# Patient Record
Sex: Female | Born: 1980 | Race: White | Hispanic: No | Marital: Married | Smoking: Never smoker
Health system: Southern US, Community
[De-identification: ages and names within clinical notes are randomized; demographics above are authoritative.]

## PROBLEM LIST (undated history)

## (undated) DIAGNOSIS — L309 Dermatitis, unspecified: Secondary | ICD-10-CM

## (undated) DIAGNOSIS — E079 Disorder of thyroid, unspecified: Secondary | ICD-10-CM

## (undated) DIAGNOSIS — B360 Pityriasis versicolor: Secondary | ICD-10-CM

## (undated) DIAGNOSIS — E039 Hypothyroidism, unspecified: Secondary | ICD-10-CM

## (undated) HISTORY — DX: Hypothyroidism, unspecified: E03.9

## (undated) HISTORY — DX: Disorder of thyroid, unspecified: E07.9

## (undated) HISTORY — DX: Pityriasis versicolor: B36.0

## (undated) HISTORY — DX: Dermatitis, unspecified: L30.9

---

## 2007-03-20 ENCOUNTER — Encounter: Admission: RE | Admit: 2007-03-20 | Discharge: 2007-03-20 | Payer: Self-pay | Admitting: Obstetrics and Gynecology

## 2007-06-11 ENCOUNTER — Inpatient Hospital Stay (HOSPITAL_COMMUNITY): Admission: AD | Admit: 2007-06-11 | Discharge: 2007-06-13 | Payer: Self-pay | Admitting: Obstetrics and Gynecology

## 2010-02-24 ENCOUNTER — Inpatient Hospital Stay (HOSPITAL_COMMUNITY): Admission: RE | Admit: 2010-02-24 | Discharge: 2010-02-26 | Payer: Self-pay | Admitting: Obstetrics and Gynecology

## 2011-02-25 LAB — CBC
HCT: 34 % — ABNORMAL LOW (ref 36.0–46.0)
Hemoglobin: 11.4 g/dL — ABNORMAL LOW (ref 12.0–15.0)
RBC: 3.76 MIL/uL — ABNORMAL LOW (ref 3.87–5.11)
RBC: 4.2 MIL/uL (ref 3.87–5.11)
WBC: 13.6 10*3/uL — ABNORMAL HIGH (ref 4.0–10.5)
WBC: 9.2 10*3/uL (ref 4.0–10.5)

## 2011-02-25 LAB — RPR: RPR Ser Ql: NONREACTIVE

## 2011-09-18 LAB — CBC
HCT: 31.6 — ABNORMAL LOW
HCT: 39.2
Hemoglobin: 12.9
MCHC: 33
MCV: 88.1
MCV: 88.6
Platelets: 164
RDW: 14.6 — ABNORMAL HIGH
RDW: 14.7 — ABNORMAL HIGH
WBC: 14 — ABNORMAL HIGH

## 2013-03-02 ENCOUNTER — Telehealth: Payer: Self-pay | Admitting: *Deleted

## 2013-03-02 NOTE — Telephone Encounter (Signed)
NEEDS REFILL ON THYROID MED WITH UPDATED DOSAGE. PHARMACY [facility] IN [place] ON [address]

## 2013-03-05 NOTE — Telephone Encounter (Signed)
Her Levothyroxine was called in for 90 days with 1 refill. Her pharmacy did not keep her original rx given on 07/07/12. PG

## 2013-11-16 ENCOUNTER — Encounter: Payer: Self-pay | Admitting: *Deleted

## 2013-11-16 DIAGNOSIS — H01139 Eczematous dermatitis of unspecified eye, unspecified eyelid: Secondary | ICD-10-CM | POA: Insufficient documentation

## 2013-11-16 DIAGNOSIS — L309 Dermatitis, unspecified: Secondary | ICD-10-CM | POA: Insufficient documentation

## 2013-11-16 DIAGNOSIS — E039 Hypothyroidism, unspecified: Secondary | ICD-10-CM | POA: Insufficient documentation

## 2013-11-17 ENCOUNTER — Other Ambulatory Visit: Payer: Self-pay | Admitting: *Deleted

## 2013-11-17 DIAGNOSIS — R5381 Other malaise: Secondary | ICD-10-CM

## 2013-11-17 DIAGNOSIS — E039 Hypothyroidism, unspecified: Secondary | ICD-10-CM

## 2013-11-18 ENCOUNTER — Other Ambulatory Visit: Payer: Self-pay

## 2013-11-18 LAB — CBC WITH DIFFERENTIAL/PLATELET
Basophils Absolute: 0 10*3/uL (ref 0.0–0.1)
Basophils Relative: 1 % (ref 0–1)
Eosinophils Absolute: 0.2 10*3/uL (ref 0.0–0.7)
Eosinophils Relative: 4 % (ref 0–5)
HCT: 43.4 % (ref 36.0–46.0)
Hemoglobin: 14.5 g/dL (ref 12.0–15.0)
Lymphocytes Relative: 42 % (ref 12–46)
Lymphs Abs: 2.4 10*3/uL (ref 0.7–4.0)
MCH: 29.1 pg (ref 26.0–34.0)
MCHC: 33.4 g/dL (ref 30.0–36.0)
MCV: 87 fL (ref 78.0–100.0)
Monocytes Absolute: 0.6 10*3/uL (ref 0.1–1.0)
Monocytes Relative: 10 % (ref 3–12)
Neutro Abs: 2.5 10*3/uL (ref 1.7–7.7)
Neutrophils Relative %: 43 % (ref 43–77)
Platelets: 267 10*3/uL (ref 150–400)
RBC: 4.99 MIL/uL (ref 3.87–5.11)
RDW: 13.2 % (ref 11.5–15.5)
WBC: 5.7 10*3/uL (ref 4.0–10.5)

## 2013-11-18 LAB — COMPLETE METABOLIC PANEL WITH GFR
ALT: 8 U/L (ref 0–35)
AST: 18 U/L (ref 0–37)
Albumin: 4.5 g/dL (ref 3.5–5.2)
Alkaline Phosphatase: 40 U/L (ref 39–117)
BUN: 12 mg/dL (ref 6–23)
CO2: 26 mEq/L (ref 19–32)
Calcium: 9.3 mg/dL (ref 8.4–10.5)
Chloride: 104 mEq/L (ref 96–112)
Creat: 0.82 mg/dL (ref 0.50–1.10)
GFR, Est African American: >89 mL/min
GFR, Est Non African American: >89 mL/min
Glucose, Bld: 82 mg/dL (ref 70–99)
Potassium: 4.1 mEq/L (ref 3.5–5.3)
Sodium: 139 mEq/L (ref 135–145)
Total Bilirubin: 1.6 mg/dL — ABNORMAL HIGH (ref 0.3–1.2)
Total Protein: 7.3 g/dL (ref 6.0–8.3)

## 2013-11-18 LAB — TSH: TSH: 2.02 u[IU]/mL (ref 0.350–4.500)

## 2013-11-18 LAB — T3, FREE: T3, Free: 3.2 pg/mL (ref 2.3–4.2)

## 2013-11-18 LAB — T4, FREE: Free T4: 1.06 ng/dL (ref 0.80–1.80)

## 2013-12-11 ENCOUNTER — Other Ambulatory Visit: Payer: Self-pay | Admitting: Family Medicine

## 2014-01-01 ENCOUNTER — Other Ambulatory Visit: Payer: Self-pay | Admitting: Family Medicine

## 2014-01-29 ENCOUNTER — Ambulatory Visit (INDEPENDENT_AMBULATORY_CARE_PROVIDER_SITE_OTHER): Payer: BC Managed Care – PPO | Admitting: Family Medicine

## 2014-01-29 ENCOUNTER — Encounter: Payer: Self-pay | Admitting: Family Medicine

## 2014-01-29 VITALS — BP 110/64 | HR 78 | Resp 16 | Ht 63.5 in | Wt 132.0 lb

## 2014-01-29 DIAGNOSIS — Z0000 Encounter for general adult medical examination without abnormal findings: Secondary | ICD-10-CM

## 2014-01-29 DIAGNOSIS — Z124 Encounter for screening for malignant neoplasm of cervix: Secondary | ICD-10-CM

## 2014-01-29 DIAGNOSIS — E039 Hypothyroidism, unspecified: Secondary | ICD-10-CM

## 2014-01-29 LAB — POCT URINALYSIS DIPSTICK
Bilirubin, UA: NEGATIVE
Blood, UA: NEGATIVE
Clarity, UA: NEGATIVE
Color, UA: NEGATIVE
Glucose, UA: NEGATIVE
Ketones, UA: NEGATIVE
Leukocytes, UA: NEGATIVE
Nitrite, UA: NEGATIVE
Protein, UA: NEGATIVE
Spec Grav, UA: <=1.005
Urobilinogen, UA: NEGATIVE
pH, UA: 6

## 2014-01-29 MED ORDER — LEVOTHYROXINE SODIUM 50 MCG PO TABS: 50.0000 ug | ORAL_TABLET | Freq: Every day | ORAL | Status: DC

## 2014-01-29 NOTE — Progress Notes (Signed)
Subjective:    Patient ID: Autumn Mata, female    DOB: 01/25/1981, 33 y.o.   MRN: [id number]  HPI  Autumn Mata is here today for her annual CPE/PAP.  She has done well since her last office visit.  She does not have any medical concerns today. She feels fine on her current dosage of levothyroxine.  She needs to have it refilled.      Review of Systems  Constitutional: Negative for activity change, appetite change, fatigue and unexpected weight change.  HENT: Negative for congestion, dental problem, ear pain, hearing loss, trouble swallowing and voice change.   Eyes: Negative for pain, redness and visual disturbance.  Respiratory: Negative for cough and shortness of breath.   Cardiovascular: Negative for chest pain, palpitations and leg swelling.  Gastrointestinal: Negative for nausea, vomiting, abdominal pain, diarrhea, constipation and blood in stool.  Endocrine: Negative for cold intolerance, heat intolerance, polydipsia, polyphagia and polyuria.  Genitourinary: Negative for dysuria, urgency, frequency, hematuria, vaginal discharge and pelvic pain.  Musculoskeletal: Negative for arthralgias, back pain, joint swelling, myalgias and neck pain.  Skin: Negative for rash.  Neurological: Negative for dizziness, weakness and headaches.  Hematological: Negative for adenopathy. Does not bruise/bleed easily.  Psychiatric/Behavioral: Negative for sleep disturbance, dysphoric mood and decreased concentration. The patient is not nervous/anxious.      Past Medical History  Diagnosis Date  . Eczema   . Thyroid disease   . Hypothyroidism   . Tinea versicolor      History   Social History Narrative   Marital Status: Married Dietitian(Scott)   Children:  Sons (Caden 2008) & Fraser Din(Preston 2011)    Pets: Dog (Yorkie)   Living Situation: Lives with spouse and children   Occupation: Futures traderHomemaker    Education: Engineer, agriculturalHigh School Graduate   Tobacco Use/Exposure:  None    Alcohol Use:  Occasional   Drug Use:   None   Diet:  Regular   Exercise:     Hobbies: Cooking, Travel            Family History  Problem Relation Age of Onset  . Hypertension Father   . Asthma Father   . Heart attack Father   . Asthma Sister     No Known Allergies   Immunization History  Administered Date(s) Administered  . Tdap 07/04/2006       Objective:   Physical Exam  Constitutional: She is oriented to person, place, and time. She appears well-developed and well-nourished.  HENT:  Head: Normocephalic and atraumatic.  Right Ear: External ear normal.  Left Ear: External ear normal.  Nose: Nose normal.  Mouth/Throat: Oropharynx is clear and moist.  Eyes: Conjunctivae and EOM are normal. Pupils are equal, round, and reactive to light.  Neck: Normal range of motion. No thyromegaly present.  Cardiovascular: Normal rate, regular rhythm, normal heart sounds and intact distal pulses.  Exam reveals no gallop and no friction rub.   No murmur heard. Pulmonary/Chest: Effort normal and breath sounds normal. Right breast exhibits no inverted nipple, no mass, no nipple discharge, no skin change and no tenderness. Left breast exhibits no inverted nipple, no mass, no nipple discharge, no skin change and no tenderness. Breasts are symmetrical.  Abdominal: Soft. Bowel sounds are normal. Hernia confirmed negative in the right inguinal area and confirmed negative in the left inguinal area.  Genitourinary: Vagina normal and uterus normal. Pelvic exam was performed with patient supine. There is no rash, tenderness or lesion on the right labia. There is  no rash, tenderness or lesion on the left labia. No vaginal discharge found.  Musculoskeletal: Normal range of motion. She exhibits no edema and no tenderness.  Lymphadenopathy:    She has no cervical adenopathy.       Right: No inguinal adenopathy present.       Left: No inguinal adenopathy present.  Neurological: She is alert and oriented to person, place, and time. She has  normal reflexes.  Skin: Skin is warm and dry.  Psychiatric: She has a normal mood and affect. Her behavior is normal. Judgment and thought content normal.      Assessment & Plan:  Autumn Mata was seen today for annual exam.  Diagnoses and associated orders for this visit:  Routine general medical examination at a health care facility The patient had a normal CPE.  We addressed preventative issues appropriate for her age.  Her U/A was WNL.  - POCT urinalysis dipstick  Screening for malignant neoplasm of the cervix -  Cytology - PAP  Unspecified hypothyroidism - levothyroxine (SYNTHROID, LEVOTHROID) 50 MCG tablet; Take 1 tablet (50 mcg total) by mouth daily before breakfast.

## 2014-01-29 NOTE — Patient Instructions (Signed)

## 2014-02-05 ENCOUNTER — Other Ambulatory Visit (HOSPITAL_COMMUNITY)
Admission: RE | Admit: 2014-02-05 | Discharge: 2014-02-05 | Disposition: A | Discharge status: Home / Self Care | Payer: BC Managed Care – PPO | Source: Ambulatory Visit | Attending: Family Medicine | Admitting: Family Medicine

## 2014-02-05 ENCOUNTER — Other Ambulatory Visit: Payer: BC Managed Care – PPO | Admitting: Family Medicine

## 2014-02-05 DIAGNOSIS — Z01419 Encounter for gynecological examination (general) (routine) without abnormal findings: Secondary | ICD-10-CM | POA: Insufficient documentation

## 2014-02-05 DIAGNOSIS — Z1151 Encounter for screening for human papillomavirus (HPV): Secondary | ICD-10-CM | POA: Insufficient documentation

## 2015-01-24 ENCOUNTER — Other Ambulatory Visit: Payer: BC Managed Care – PPO

## 2015-01-31 ENCOUNTER — Other Ambulatory Visit: Payer: BC Managed Care – PPO | Admitting: Family Medicine

## 2016-10-11 ENCOUNTER — Other Ambulatory Visit: Payer: Self-pay | Admitting: Obstetrics and Gynecology

## 2016-10-15 LAB — CYTOLOGY - PAP

## 2021-08-30 ENCOUNTER — Other Ambulatory Visit: Payer: Self-pay | Admitting: Obstetrics and Gynecology

## 2021-08-30 DIAGNOSIS — R928 Other abnormal and inconclusive findings on diagnostic imaging of breast: Secondary | ICD-10-CM

## 2021-09-21 ENCOUNTER — Other Ambulatory Visit: Payer: Self-pay | Admitting: Obstetrics and Gynecology

## 2021-09-21 ENCOUNTER — Ambulatory Visit
Admission: RE | Admit: 2021-09-21 | Discharge: 2021-09-21 | Disposition: A | Discharge status: Home / Self Care | Payer: Self-pay | Source: Ambulatory Visit | Attending: Obstetrics and Gynecology | Admitting: Obstetrics and Gynecology

## 2021-09-21 ENCOUNTER — Ambulatory Visit
Admission: RE | Admit: 2021-09-21 | Discharge: 2021-09-21 | Disposition: A | Discharge status: Home / Self Care | Payer: BC Managed Care – PPO | Source: Ambulatory Visit | Attending: Obstetrics and Gynecology | Admitting: Obstetrics and Gynecology

## 2021-09-21 ENCOUNTER — Other Ambulatory Visit: Payer: Self-pay

## 2021-09-21 DIAGNOSIS — R928 Other abnormal and inconclusive findings on diagnostic imaging of breast: Secondary | ICD-10-CM

## 2022-04-02 ENCOUNTER — Ambulatory Visit
Admission: RE | Admit: 2022-04-02 | Discharge: 2022-04-02 | Disposition: A | Discharge status: Home / Self Care | Payer: BC Managed Care – PPO | Source: Ambulatory Visit | Attending: Obstetrics and Gynecology | Admitting: Obstetrics and Gynecology

## 2022-04-02 ENCOUNTER — Ambulatory Visit: Payer: BC Managed Care – PPO

## 2022-04-02 ENCOUNTER — Other Ambulatory Visit: Payer: Self-pay | Admitting: Obstetrics and Gynecology

## 2022-04-02 DIAGNOSIS — N6489 Other specified disorders of breast: Secondary | ICD-10-CM

## 2022-04-02 DIAGNOSIS — R928 Other abnormal and inconclusive findings on diagnostic imaging of breast: Secondary | ICD-10-CM

## 2022-08-17 NOTE — Progress Notes (Signed)
Office Visit Note  Patient: Autumn Mata             Date of Birth: 1981-10-02           MRN: [id number]             PCP: Jonathon Resides, MD Referring: Kerney Elbe, MD Visit Date: 08/30/2022 Occupation: @GUAROCC @  Subjective:  Pain in both feet  History of Present Illness: Autumn Mata is a 41 y.o. female in consultation per request of her PCP.  According to the patient her symptoms started about 4 years ago with shoulder joint pain.  She states she had 3 episodes of severe shoulder joint pain for 1-1/2 years.  She states gradually the pain became more frequent and is spread to the other joints.  She is started having pain in her lower back, wrist joints and her feet.  She was seen by an orthopedic surgeon at the time who did x-rays of the shoulders and lab work.  Her labs came positive for rheumatoid factor.  She was referred to a rheumatologist Dr. Tamera Punt at [facility] who evaluated her and no treatment was offered she was advised to come back on as needed basis.  She states she went to [place] where she was given some supplements and dietary modifications were made.  She states the pain resolved and he was pain-free for about 6 months to a year.  She states for the last 1-1/2 years she has been having pain and discomfort in her bilateral feet which gets worse on walking or standing.  She denies any morning stiffness.  She describes pain mostly under her MTPs.  She was seen by chiropractor who told her that she may have tight tendons.  None of the other joints are painful.  There is no family history of autoimmune disease.  She states her father had osteoarthritis most likely.  And his birth control method now she is gravida 2 and para 2 miscarriages 0.    Activities of Daily Living:  Patient reports morning stiffness for 0 minutes.   Patient Denies nocturnal pain.  Difficulty dressing/grooming: Denies Difficulty climbing stairs: Denies Difficulty getting out of  chair: Denies Difficulty using hands for taps, buttons, cutlery, and/or writing: Reports  Review of Systems  Constitutional:  Negative for fatigue.  HENT:  Negative for mouth sores and mouth dryness.   Eyes:  Positive for dryness.  Respiratory:  Negative for shortness of breath.   Cardiovascular:  Negative for chest pain and palpitations.  Gastrointestinal:  Negative for blood in stool, constipation and diarrhea.  Endocrine: Negative for increased urination.  Genitourinary:  Negative for involuntary urination.  Musculoskeletal:  Positive for joint pain and joint pain. Negative for gait problem, joint swelling, myalgias, muscle weakness, morning stiffness, muscle tenderness and myalgias.  Skin:  Negative for color change, rash, hair loss and sensitivity to sunlight.  Allergic/Immunologic: Negative for susceptible to infections.  Neurological:  Negative for dizziness and headaches.  Hematological:  Negative for swollen glands.  Psychiatric/Behavioral:  Negative for depressed mood and sleep disturbance. The patient is not nervous/anxious.     PMFS History:  Patient Active Problem List   Diagnosis Date Noted   Pes cavus of both feet 08/30/2022   Hammer toes of both feet 08/30/2022   Unspecified hypothyroidism 11/16/2013   Eczema 11/16/2013    Past Medical History:  Diagnosis Date   Eczema    Hypothyroidism    Thyroid disease    Tinea versicolor  Family History  Problem Relation Age of Onset   Stroke Mother    Hypertension Father    Asthma Father    Heart attack Father    Asthma Sister    Healthy Son    Healthy Son    History reviewed. No pertinent surgical history. Social History   Social History Narrative   Marital Status: Married Acupuncturist)   Children:  Sons (Caden 2008) & (Preston 2011)    Pets: Dog (Yorkie)   Living Situation: Lives with spouse and children   Occupation: Agricultural engineer    Education: Programmer, systems   Tobacco Use/Exposure:  None    Alcohol Use:   Occasional   Drug Use:  None   Diet:  Regular   Exercise:     Hobbies: Cooking, Travel          Immunization History  Administered Date(s) Administered   Tdap 07/04/2006     Objective: Vital Signs: BP 101/69 (BP Location: Right Arm, Patient Position: Sitting, Cuff Size: Normal)   Pulse 87   Resp 14   Ht 5' 4"  (1.626 m)   Wt 124 lb 6.4 oz (56.4 kg)   BMI 21.35 kg/m    Physical Exam Vitals and nursing note reviewed.  Constitutional:      Appearance: She is well-developed.  HENT:     Head: Normocephalic and atraumatic.  Eyes:     Conjunctiva/sclera: Conjunctivae normal.  Cardiovascular:     Rate and Rhythm: Normal rate and regular rhythm.     Heart sounds: Normal heart sounds.  Pulmonary:     Effort: Pulmonary effort is normal.     Breath sounds: Normal breath sounds.  Abdominal:     General: Bowel sounds are normal.     Palpations: Abdomen is soft.  Musculoskeletal:     Cervical back: Normal range of motion.  Lymphadenopathy:     Cervical: No cervical adenopathy.  Skin:    General: Skin is warm and dry.     Capillary Refill: Capillary refill takes less than 2 seconds.  Neurological:     Mental Status: She is alert and oriented to person, place, and time.  Psychiatric:        Behavior: Behavior normal.      Musculoskeletal Exam: Cervical, thoracic and lumbar spine were in good range of motion.  She had no SI joint tenderness.  Shoulder joints, elbow joints, wrist joints, MCPs PIPs and DIPs been good range of motion.  She has some PIP and DIP thickening.  She has hypermobility in most of her joints.  Hip joints and knee joints with good range of motion.  She had no tenderness over ankles or MTPs.  She had callus formation under her right foot.  She had hammertoes and bilateral pes cavus.  CDAI Exam: CDAI Score: -- Patient Global: --; Provider Global: -- Swollen: --; Tender: -- Joint Exam 08/30/2022   No joint exam has been documented for this visit   There  is currently no information documented on the homunculus. Go to the Rheumatology activity and complete the homunculus joint exam.  Investigation: No additional findings.  Imaging: No results found.  Recent Labs: Lab Results  Component Value Date   WBC 5.7 11/18/2013   HGB 14.5 11/18/2013   PLT 267 11/18/2013   NA 139 11/18/2013   K 4.1 11/18/2013   CL 104 11/18/2013   CO2 26 11/18/2013   GLUCOSE 82 11/18/2013   BUN 12 11/18/2013   CREATININE 0.82 11/18/2013  BILITOT 1.6 (H) 11/18/2013   ALKPHOS 40 11/18/2013   AST 18 11/18/2013   ALT 8 11/18/2013   PROT 7.3 11/18/2013   ALBUMIN 4.5 11/18/2013   CALCIUM 9.3 11/18/2013   GFRAA >89 11/18/2013    Speciality Comments: No specialty comments available.  Procedures:  No procedures performed Allergies: Patient has no known allergies.   Assessment / Plan:     Visit Diagnoses: Pain in both hands -she intermittent pain in her hands and her wrist joints.  She has not had symptoms in a while.  She is significantly high positive rheumatoid factor, positive anti-CCP and positive ANA in the past.  I will obtain baseline x-rays today.  Plan: XR Hand 2 View Right, XR Hand 2 View Left.  X-rays of bilateral hands were unremarkable.  Pain in both feet -she gives history of severe pain and discomfort in her feet for the last 1-1/2-year.  She states the pain is worse when she is standing or walking.  She denies any morning stiffness.  She notices some swelling in her feet.  No synovitis was noted.  I will repeat labs today.  Plan: XR Foot 2 Views Right, XR Foot 2 Views Left, x-rays showed left fourth and fifth MTP narrowing most likely due to hammertoes.  Pes cavus was noted.  If patient has ongoing pain and discomfort in her feet I will consider ultrasound of bilateral hands to evaluate this further.  Sedimentation rate, Rheumatoid factor, Cyclic citrul peptide antibody, IgG, Ambulatory referral to Podiatry  Pes cavus of both feet -she has  bilateral pes cavus.  She will benefit from orthotics.  Plan: Ambulatory referral to Podiatry  Hammer toes of both feet -she has bilateral hammertoes.  She has developed some ulceration on the dorsal surface of her PIPs.  She also has callus formation under her MTPs.  She will benefit from orthotics.  Stretching exercises were discussed.  Plan: Ambulatory referral to Podiatry  Positive ANA (antinuclear antibody) -she had positive ANA.  She also gives history of sicca symptoms.  There is no history of oral ulcers, nasal ulcers, malar rash, photosensitivity, Raynaud's phenomenon or lymphadenopathy.  I will obtain labs today.  Plan: CBC with Differential/Platelet, COMPLETE METABOLIC PANEL WITH GFR, ANA, Anti-scleroderma antibody, RNP Antibody, Anti-Smith antibody, Sjogrens syndrome-A extractable nuclear antibody, Sjogrens syndrome-B extractable nuclear antibody, Anti-DNA antibody, double-stranded, C3 and C4, Beta-2 glycoprotein antibodies, Cardiolipin antibodies, IgG, IgM, IgA, Glucose 6 phosphate dehydrogenase  Rheumatoid factor positive - 01/27/20:+RF 40 and anti-CCP 140, ANA 1:320, ESR 7, CRP<5 but no clinical evidence of RA.  Evaluated at [facility] on 06/19/[facility]-Dr. Belfi.  Dry eyes-use of over-the-counter products were discussed.  History of hypothyroidism  Vitamin D deficiency-she takes vitamin D.  Tinea versicolor-she gets intermittent rash.  History of herpes genitalis  History of Meniere's disease-she is on Dyazide.  Orders: Orders Placed This Encounter  Procedures   XR Hand 2 View Right   XR Hand 2 View Left   XR Foot 2 Views Right   XR Foot 2 Views Left   CBC with Differential/Platelet   COMPLETE METABOLIC PANEL WITH GFR   Sedimentation rate   Rheumatoid factor   Cyclic citrul peptide antibody, IgG   ANA   Anti-scleroderma antibody   RNP Antibody   Anti-Smith antibody   Sjogrens syndrome-A extractable nuclear antibody   Sjogrens syndrome-B extractable nuclear antibody   Anti-DNA  antibody, double-stranded   C3 and C4   Beta-2 glycoprotein antibodies   Cardiolipin antibodies, IgG, IgM, IgA  Glucose 6 phosphate dehydrogenase   Ambulatory referral to Podiatry   No orders of the defined types were placed in this encounter.  .  Follow-Up Instructions: Return for +RF, +ANA, joint pain.   Bo Merino, MD  Note - This record has been created using Editor, commissioning.  Chart creation errors have been sought, but may not always  have been located. Such creation errors do not reflect on  the standard of medical care.

## 2022-08-30 ENCOUNTER — Ambulatory Visit (INDEPENDENT_AMBULATORY_CARE_PROVIDER_SITE_OTHER): Payer: BC Managed Care – PPO

## 2022-08-30 ENCOUNTER — Encounter: Payer: Self-pay | Admitting: Rheumatology

## 2022-08-30 ENCOUNTER — Ambulatory Visit: Payer: BC Managed Care – PPO | Attending: Rheumatology | Admitting: Rheumatology

## 2022-08-30 VITALS — BP 101/69 | HR 87 | Resp 14 | Ht 64.0 in | Wt 124.4 lb

## 2022-08-30 DIAGNOSIS — M79672 Pain in left foot: Secondary | ICD-10-CM | POA: Diagnosis not present

## 2022-08-30 DIAGNOSIS — M79671 Pain in right foot: Secondary | ICD-10-CM | POA: Diagnosis not present

## 2022-08-30 DIAGNOSIS — Q6671 Congenital pes cavus, right foot: Secondary | ICD-10-CM

## 2022-08-30 DIAGNOSIS — Q6672 Congenital pes cavus, left foot: Secondary | ICD-10-CM

## 2022-08-30 DIAGNOSIS — M79642 Pain in left hand: Secondary | ICD-10-CM

## 2022-08-30 DIAGNOSIS — M2041 Other hammer toe(s) (acquired), right foot: Secondary | ICD-10-CM | POA: Diagnosis not present

## 2022-08-30 DIAGNOSIS — M79641 Pain in right hand: Secondary | ICD-10-CM

## 2022-08-30 DIAGNOSIS — Z8619 Personal history of other infectious and parasitic diseases: Secondary | ICD-10-CM

## 2022-08-30 DIAGNOSIS — Z8669 Personal history of other diseases of the nervous system and sense organs: Secondary | ICD-10-CM

## 2022-08-30 DIAGNOSIS — H04123 Dry eye syndrome of bilateral lacrimal glands: Secondary | ICD-10-CM

## 2022-08-30 DIAGNOSIS — E559 Vitamin D deficiency, unspecified: Secondary | ICD-10-CM

## 2022-08-30 DIAGNOSIS — B360 Pityriasis versicolor: Secondary | ICD-10-CM

## 2022-08-30 DIAGNOSIS — Z8659 Personal history of other mental and behavioral disorders: Secondary | ICD-10-CM

## 2022-08-30 DIAGNOSIS — M2042 Other hammer toe(s) (acquired), left foot: Secondary | ICD-10-CM

## 2022-08-30 DIAGNOSIS — R768 Other specified abnormal immunological findings in serum: Secondary | ICD-10-CM | POA: Diagnosis not present

## 2022-08-30 DIAGNOSIS — Z872 Personal history of diseases of the skin and subcutaneous tissue: Secondary | ICD-10-CM

## 2022-08-30 DIAGNOSIS — Z8639 Personal history of other endocrine, nutritional and metabolic disease: Secondary | ICD-10-CM

## 2022-09-04 LAB — COMPLETE METABOLIC PANEL WITH GFR
AG Ratio: 1.6 (calc) (ref 1.0–2.5)
ALT: 17 U/L (ref 6–29)
AST: 18 U/L (ref 10–30)
Albumin: 4.6 g/dL (ref 3.6–5.1)
Alkaline phosphatase (APISO): 51 U/L (ref 31–125)
BUN: 10 mg/dL (ref 7–25)
CO2: 30 mmol/L (ref 20–32)
Calcium: 9.8 mg/dL (ref 8.6–10.2)
Chloride: 97 mmol/L — ABNORMAL LOW (ref 98–110)
Creat: 0.77 mg/dL (ref 0.50–0.99)
Globulin: 2.8 g/dL (calc) (ref 1.9–3.7)
Glucose, Bld: 84 mg/dL (ref 65–99)
Potassium: 3.8 mmol/L (ref 3.5–5.3)
Sodium: 137 mmol/L (ref 135–146)
Total Bilirubin: 1.2 mg/dL (ref 0.2–1.2)
Total Protein: 7.4 g/dL (ref 6.1–8.1)
eGFR: 99 mL/min/{1.73_m2} (ref >=60)

## 2022-09-04 LAB — ANTI-NUCLEAR AB-TITER (ANA TITER): ANA Titer 1: 1:320 {titer} — ABNORMAL HIGH

## 2022-09-04 LAB — RNP ANTIBODY: Ribonucleic Protein(ENA) Antibody, IgG: <1 AI

## 2022-09-04 LAB — CBC WITH DIFFERENTIAL/PLATELET
Absolute Monocytes: 646 cells/uL (ref 200–950)
Basophils Absolute: 38 cells/uL (ref 0–200)
Basophils Relative: 0.6 %
Eosinophils Absolute: 102 cells/uL (ref 15–500)
Eosinophils Relative: 1.6 %
HCT: 47.4 % — ABNORMAL HIGH (ref 35.0–45.0)
Hemoglobin: 15.5 g/dL (ref 11.7–15.5)
Lymphs Abs: 2042 cells/uL (ref 850–3900)
MCH: 29.2 pg (ref 27.0–33.0)
MCHC: 32.7 g/dL (ref 32.0–36.0)
MCV: 89.3 fL (ref 80.0–100.0)
MPV: 10 fL (ref 7.5–12.5)
Monocytes Relative: 10.1 %
Neutro Abs: 3571 cells/uL (ref 1500–7800)
Neutrophils Relative %: 55.8 %
Platelets: 307 10*3/uL (ref 140–400)
RBC: 5.31 10*6/uL — ABNORMAL HIGH (ref 3.80–5.10)
RDW: 11.6 % (ref 11.0–15.0)
Total Lymphocyte: 31.9 %
WBC: 6.4 10*3/uL (ref 3.8–10.8)

## 2022-09-04 LAB — ANA: Anti Nuclear Antibody (ANA): POSITIVE — AB

## 2022-09-04 LAB — RHEUMATOID FACTOR: Rheumatoid fact SerPl-aCnc: 119 IU/mL — ABNORMAL HIGH (ref <14)

## 2022-09-04 LAB — C3 AND C4
C3 Complement: 113 mg/dL (ref 83–193)
C4 Complement: 29 mg/dL (ref 15–57)

## 2022-09-04 LAB — CARDIOLIPIN ANTIBODIES, IGG, IGM, IGA
Anticardiolipin IgA: <2 APL-U/mL (ref <20.0)
Anticardiolipin IgG: <2 GPL-U/mL (ref <20.0)
Anticardiolipin IgM: <2 MPL-U/mL (ref <20.0)

## 2022-09-04 LAB — BETA-2 GLYCOPROTEIN ANTIBODIES
Beta-2 Glyco 1 IgA: <2 U/mL (ref <20.0)
Beta-2 Glyco 1 IgM: <2 U/mL (ref <20.0)
Beta-2 Glyco I IgG: <2 U/mL (ref <20.0)

## 2022-09-04 LAB — SJOGRENS SYNDROME-A EXTRACTABLE NUCLEAR ANTIBODY: SSA (Ro) (ENA) Antibody, IgG: <1 AI

## 2022-09-04 LAB — ANTI-SMITH ANTIBODY: ENA SM Ab Ser-aCnc: <1 AI

## 2022-09-04 LAB — ANTI-DNA ANTIBODY, DOUBLE-STRANDED: ds DNA Ab: <1 IU/mL

## 2022-09-04 LAB — SJOGRENS SYNDROME-B EXTRACTABLE NUCLEAR ANTIBODY: SSB (La) (ENA) Antibody, IgG: <1 AI

## 2022-09-04 LAB — CYCLIC CITRUL PEPTIDE ANTIBODY, IGG: Cyclic Citrullin Peptide Ab: >250 UNITS — ABNORMAL HIGH

## 2022-09-04 LAB — GLUCOSE 6 PHOSPHATE DEHYDROGENASE: G-6PDH: 14.1 U/g Hgb (ref 7.0–20.5)

## 2022-09-04 LAB — SEDIMENTATION RATE: Sed Rate: 6 mm/h (ref 0–20)

## 2022-09-04 LAB — ANTI-SCLERODERMA ANTIBODY: Scleroderma (Scl-70) (ENA) Antibody, IgG: <1 AI

## 2022-09-04 NOTE — Progress Notes (Signed)
Labs are consistent with rheumatoid arthritis.  I will discuss results at the follow-up visit.

## 2022-09-07 ENCOUNTER — Ambulatory Visit: Payer: BC Managed Care – PPO | Admitting: Podiatry

## 2022-09-07 DIAGNOSIS — M7752 Other enthesopathy of left foot: Secondary | ICD-10-CM

## 2022-09-07 DIAGNOSIS — G5761 Lesion of plantar nerve, right lower limb: Secondary | ICD-10-CM

## 2022-09-07 DIAGNOSIS — M7742 Metatarsalgia, left foot: Secondary | ICD-10-CM | POA: Diagnosis not present

## 2022-09-07 DIAGNOSIS — M2042 Other hammer toe(s) (acquired), left foot: Secondary | ICD-10-CM

## 2022-09-07 DIAGNOSIS — M2041 Other hammer toe(s) (acquired), right foot: Secondary | ICD-10-CM

## 2022-09-07 MED ORDER — MELOXICAM 7.5 MG PO TABS: 7.5000 mg | ORAL_TABLET | Freq: Every day | ORAL | 0 refills | Status: DC | PRN

## 2022-09-07 NOTE — Progress Notes (Signed)
Subjective:   Patient ID: Autumn Mata, female   DOB: 41 y.o.   MRN: [id number]   HPI Chief Complaint  Patient presents with   Foot Pain    Patient came in today for foot pain, left foot hammertoes and right foot forefoot pain between the 2nd and 3rd toe(feels like walking on a marble) Rate of pain 6 out of 10, TX: roller X-Rays taken 08/30/2022   41 year old female presents with above concerns. On the right foot sub 2 feels like a "ball" on the left it feels like a cursh all on the side.  She also gets pain on her left foot on the fifth MPJ plantarly.  Hurts when she is on it. It started about 3 years ago around the time she was diagnosed with rheumatoid arthritis.  She states her rheumatologist does not feel the foot pain is related to this.  Review of Systems  All other systems reviewed and are negative.  Past Medical History:  Diagnosis Date   Eczema    Hypothyroidism    Thyroid disease    Tinea versicolor     No past surgical history on file.   Current Outpatient Medications:    meloxicam (MOBIC) 7.5 MG tablet, Take 1 tablet (7.5 mg total) by mouth daily as needed for pain., Disp: 30 tablet, Rfl: 0   5-Hydroxytryptophan (5-HTP) 100 MG CAPS, Take 100 mg by mouth daily., Disp: , Rfl:    Ascorbic Acid (VITAMIN C PO), Take by mouth. Vit C LD, Disp: , Rfl:    Cholecalciferol (VITAMIN D3 PO), Take 5,000 Units by mouth every other day., Disp: , Rfl:    Copper Gluconate (COPPER CAPS) 2 MG CAPS, Take by mouth., Disp: , Rfl:    Cyanocobalamin (VITAMIN B-12 PO), Take 1 mg by mouth daily., Disp: , Rfl:    Menaquinone-7 (VITAMIN K2 PO), Take by mouth. Vitamin K2 MK-7, Disp: , Rfl:    Multiple Vitamins-Minerals (ZINC PO), Take by mouth. Raw Zinc- 30mg  high potency, Disp: , Rfl:    NP THYROID 60 MG tablet, Take 60 mg by mouth every morning., Disp: , Rfl:    Probiotic Product (PROBIOTIC PO), Take by mouth. MegaSpore Biotic, Disp: , Rfl:    triamterene-hydrochlorothiazide (DYAZIDE)  37.5-25 MG capsule, Take 1 capsule by mouth daily., Disp: , Rfl:    TURMERIC PO, Take 500 mg by mouth daily., Disp: , Rfl:   No Known Allergies         Objective:  Physical Exam  General: AAO x3, NAD  Dermatological: Skin is warm, dry and supple bilateral.  There are no open sores, no preulcerative lesions, no rash or signs of infection present.  Vascular: Dorsalis Pedis artery and Posterior Tibial artery pedal pulses are 2/4 bilateral with immedate capillary fill time.  There is no pain with calf compression, swelling, warmth, erythema.   Neruologic: Grossly intact via light touch bilateral.   Musculoskeletal: On the right second interspace nodule present consistent with a bursa versus neuroma.  She is not experiencing nerve symptoms into the toes.  There is tenderness on the left fifth MPJ there is prominent metatarsal head plantarly with atrophy of the fat pad.  Hammertoes noted.  Muscular strength 5/5 in all groups tested bilateral.  Mild cavus foot noted.  Gait: Unassisted, Nonantalgic.       Assessment:   41 year old female with right foot bursa versus neuroma; diabetes, metatarsalgia left foot     Plan:  -Treatment options discussed including all alternatives, risks,  and complications -Etiology of symptoms were discussed -Independently reviewed the prior x-rays.  There is narrowing of the fifth MPJ on the left foot on the fourth MPJ as well.  Acute fracture. -Prescribed mobic. Discussed side effects of the medication and directed to stop if any are to occur and call the office.  -Discussed steroid injection of right foot but held off on this today. -We discussed shoe modifications and inserts to help support her feet.  She was measured for orthotics today.  Over this will help offload.  Vivi Barrack DPM

## 2022-09-12 NOTE — Progress Notes (Signed)
Office Visit Note  Patient: Autumn Mata             Date of Birth: [birth year]           MRN: [id number]             PCP: Pcp, No Referring: Kerney Elbe, MD Visit Date: 09/19/2022 Occupation: _0 @  Subjective:   Feet pain  History of Present Illness: Autumn Mata is a 41 y.o. female with history of feet pain.  She states she continues to have some discomfort in her bilateral feet.  She was evaluated by Dr. Earleen Newport and was benign make orthotics for pes cavus and hammertoes.  She has some difficulty walking due to feet discomfort.  She denies any pain and swelling in her hands now.  She has stiffness in her hands.  None of the other joints are painful.  She states that she may be pregnant today and if not then she wants to conceive in the future.  Activities of Daily Living:  Patient reports morning stiffness for 0  none .   Patient Denies nocturnal pain.  Difficulty dressing/grooming: Denies Difficulty climbing stairs: Denies Difficulty getting out of chair: Denies Difficulty using hands for taps, buttons, cutlery, and/or writing: Reports  Review of Systems  Constitutional:  Negative for fatigue.  HENT:  Negative for mouth sores and mouth dryness.   Eyes:  Negative for dryness.  Respiratory:  Negative for shortness of breath.   Cardiovascular:  Negative for chest pain and palpitations.  Gastrointestinal:  Negative for blood in stool, constipation and diarrhea.  Endocrine: Negative for increased urination.  Genitourinary:  Negative for involuntary urination.  Musculoskeletal:  Positive for joint pain, joint pain and joint swelling. Negative for gait problem, myalgias, muscle weakness, morning stiffness, muscle tenderness and myalgias.  Skin:  Negative for color change, rash, hair loss and sensitivity to sunlight.  Allergic/Immunologic: Negative for susceptible to infections.  Neurological:  Negative for dizziness and headaches.  Hematological:  Negative  for swollen glands.  Psychiatric/Behavioral:  Negative for depressed mood and sleep disturbance. The patient is not nervous/anxious.     PMFS History:  Patient Active Problem List   Diagnosis Date Noted   Rheumatoid arthritis involving multiple sites with positive rheumatoid factor ([place]) 09/19/2022   Pes cavus of both feet 08/30/2022   Hammer toes of both feet 08/30/2022   Unspecified hypothyroidism 11/16/2013   Eczema 11/16/2013    Past Medical History:  Diagnosis Date   Eczema    Hypothyroidism    Thyroid disease    Tinea versicolor     Family History  Problem Relation Age of Onset   Stroke Mother    Hypertension Father    Asthma Father    Heart attack Father    Asthma Sister    Healthy Son    Healthy Son    History reviewed. No pertinent surgical history. Social History   Social History Narrative   Marital Status: Married Acupuncturist)   Children:  Sons (Caden 2008) & (Preston 2011)    Pets: Dog (Yorkie)   Living Situation: Lives with spouse and children   Occupation: Agricultural engineer    Education: Programmer, systems   Tobacco Use/Exposure:  None    Alcohol Use:  Occasional   Drug Use:  None   Diet:  Regular   Exercise:     Hobbies: Cooking, Travel          Immunization History  Administered Date(s) Administered   Tdap 07/04/2006  Objective: Vital Signs: BP 129/71 (BP Location: Left Arm, Patient Position: Sitting, Cuff Size: Normal)   Pulse 61   Resp 14   Ht _0  (1.626 m)   Wt 127 lb (57.6 kg)   BMI 21.80 kg/m    Physical Exam Vitals and nursing note reviewed.  Constitutional:      Appearance: She is well-developed.  HENT:     Head: Normocephalic and atraumatic.  Eyes:     Conjunctiva/sclera: Conjunctivae normal.  Cardiovascular:     Rate and Rhythm: Normal rate and regular rhythm.     Heart sounds: Normal heart sounds.  Pulmonary:     Effort: Pulmonary effort is normal.     Breath sounds: Normal breath sounds.  Abdominal:     General: Bowel  sounds are normal.     Palpations: Abdomen is soft.  Musculoskeletal:     Cervical back: Normal range of motion.  Lymphadenopathy:     Cervical: No cervical adenopathy.  Skin:    General: Skin is warm and dry.     Capillary Refill: Capillary refill takes less than 2 seconds.  Neurological:     Mental Status: She is alert and oriented to person, place, and time.  Psychiatric:        Behavior: Behavior normal.      Musculoskeletal Exam: Cervical spine was in good range of motion.  There was no tenderness over thoracic or lumbar spine.  Shoulder joints, elbow joints, wrist joints, MCPs PIPs and DIPs with good range of motion with no synovitis.  Hip joints and knee joints with good range of motion.  She had tenderness and warmth on palpation over her left fourth and fifth MTPs.  She had tenderness on palpation over her right second MTP joint.  CDAI Exam: CDAI Score: 1  Patient Global: 5 mm; Provider Global: 5 mm Swollen: 2 ; Tender: 3  Joint Exam 09/19/2022      Right  Left  MTP 2   Tender     MTP 4     Swollen Tender  MTP 5     Swollen Tender     Investigation: No additional findings.  Imaging: XR Foot 2 Views Right  Result Date: 09/19/2022 No MTP, PIP or DIP narrowing was noted.  Erosion was noted over the base of third proximal phalanx.  No intertarsal, tibiotalar or subtalar joint space narrowing was noted.  Pes cavus and hammertoes were noted. Impression: The erosion on the third proximal phalanx raises concern of rheumatoid arthritis.  XR Foot 2 Views Left  Result Date: 09/19/2022 Narrowing of fourth and fifth MTP joints was noted.  Erosion was noted over the left fifth metatarsal head.  No PIP and DIP joint space narrowing was noted.  No intertarsal, tibiotalar or subtalar joint space narrowing was noted.  Pes cavus and hammertoes were noted.  Impression: Fourth and fifth MTP joint space narrowing was noted.  Erosion was noted over the left fifth metatarsal head most  likely due to rheumatoid arthritis.  XR Hand 2 View Left  Result Date: 08/30/2022 No MCP PIP or DIP narrowing was noted.  No intercarpal or radiocarpal joint space narrowing was noted.  No erosive changes were noted. Impression: Unremarkable x-rays of the hand.  XR Hand 2 View Right  Result Date: 08/30/2022 No MCP PIP or DIP narrowing was noted.  No intercarpal or radiocarpal joint space narrowing was noted.  No erosive changes were noted. Impression: Unremarkable x-rays of the hand.   Recent Labs:  Lab Results  Component Value Date   WBC 6.4 08/30/2022   HGB 15.5 08/30/2022   PLT 307 08/30/2022   NA 137 08/30/2022   K 3.8 08/30/2022   CL 97 (L) 08/30/2022   CO2 30 08/30/2022   GLUCOSE 84 08/30/2022   BUN 10 08/30/2022   CREATININE 0.77 08/30/2022   BILITOT 1.2 08/30/2022   ALKPHOS 40 11/18/2013   AST 18 08/30/2022   ALT 17 08/30/2022   PROT 7.4 08/30/2022   ALBUMIN 4.5 11/18/2013   CALCIUM 9.8 08/30/2022   GFRAA >89 11/18/2013    August 30, 2022 ANA 1: 320NH, ENA (SCL 70, RNP, Smith, SSA, SSB, dsDNA) negative, C3-C4 normal, beta-2 GP 1 negative, anticardiolipin negative, ESR 6, RF 119, anti-CCP>250, G6PD normal  Speciality Comments: No specialty comments available.  Procedures:  No procedures performed Allergies: Patient has no known allergies.   Assessment / Plan:     Visit Diagnoses: Rheumatoid arthritis involving multiple sites with positive rheumatoid factor (HCC) - Positive RF, positive anti-CCP, positive ANA,erosive disease.  Patient continues to have pain and discomfort in her bilateral feet.  She states the pain is manageable and is not bothersome.  She had synovitis on palpation over her left fourth and fifth toe.  I also reviewed x-rays with the patient about the erosive changes over the right third proximal phalanx and left fifth metatarsal head.  These findings are consistent with severe erosive rheumatoid arthritis.  She gives history of intermittent pain and  discomfort in her hands.  No synovitis was noted.  X-rays were unremarkable of her hands.  Different treatment options and their side effects were discussed at length.  Patient is concerned about the side effects of most medications and is stated that she may be pregnant today.  I had a detailed discussion with her regarding possible use of Cimzia.  Indications side effects contraindications were discussed at length.  I also gave her information of hydroxychloroquine if she does not want to consider Cimzia.  She would like to take information on both medications.  She would like to wait until she finds out if she is pregnant or not.  Patient wants to discuss the treatment plan with her husband and will let us know at the follow-up visit.  She will be back in couple of months.  I also offered low-dose prednisone to decrease inflammation in her feet but she declined.  High risk medication use-prior to starting on Cimzia she will need additional labs including TB gold, hepatitis panel, SPEP and immunoglobulins.  Positive ANA (antinuclear antibody) - ENA negative, complements normal, beta-2 GP 1 negative, anticardiolipin negative.  Lab results were discussed with the patient.  Association of positive ANA with rheumatoid arthritis was also discussed.  Pain in both hands -she has intermittent discomfort in her hands.  X-rays of bilateral hands were unremarkable.  Pes cavus of both feet - History of severe pain and discomfort in her bilateral feet.  Patient notices swelling in her feet intermittently.  She had synovitis over her left fourth and fifth toe today.  I reviewed the x-rays with her.  She has erosive changes in her bilateral feet.  She is getting orthotics by Dr. Jacqualyn Posey.  She states she may also get cortisone injection to her toes.  Hammer toes of both feet - Patient had callus formation under her feet.  Dry eyes-over-the-counter products were discussed.  Vitamin D deficiency-she is on vitamin D  supplement.  History of hypothyroidism  Tinea versicolor - History  of intermittent flares.  History of herpes genitalis  History of Meniere's disease - Treated with Dyazide.  Orders: No orders of the defined types were placed in this encounter.  No orders of the defined types were placed in this encounter.    Follow-Up Instructions: Return in about 2 months (around 11/19/2022) for Rheumatoid arthritis.   Bo Merino, MD  Note - This record has been created using Editor, commissioning.  Chart creation errors have been sought, but may not always  have been located. Such creation errors do not reflect on  the standard of medical care.

## 2022-09-19 ENCOUNTER — Encounter: Payer: Self-pay | Admitting: Rheumatology

## 2022-09-19 ENCOUNTER — Ambulatory Visit: Payer: BC Managed Care – PPO | Attending: Rheumatology | Admitting: Rheumatology

## 2022-09-19 VITALS — BP 129/71 | HR 61 | Resp 14 | Ht 64.0 in | Wt 127.0 lb

## 2022-09-19 DIAGNOSIS — H04123 Dry eye syndrome of bilateral lacrimal glands: Secondary | ICD-10-CM

## 2022-09-19 DIAGNOSIS — M79641 Pain in right hand: Secondary | ICD-10-CM | POA: Diagnosis not present

## 2022-09-19 DIAGNOSIS — R768 Other specified abnormal immunological findings in serum: Secondary | ICD-10-CM

## 2022-09-19 DIAGNOSIS — M0579 Rheumatoid arthritis with rheumatoid factor of multiple sites without organ or systems involvement: Secondary | ICD-10-CM | POA: Insufficient documentation

## 2022-09-19 DIAGNOSIS — Z79899 Other long term (current) drug therapy: Secondary | ICD-10-CM

## 2022-09-19 DIAGNOSIS — M2041 Other hammer toe(s) (acquired), right foot: Secondary | ICD-10-CM

## 2022-09-19 DIAGNOSIS — Q6671 Congenital pes cavus, right foot: Secondary | ICD-10-CM

## 2022-09-19 DIAGNOSIS — B360 Pityriasis versicolor: Secondary | ICD-10-CM

## 2022-09-19 DIAGNOSIS — Q6672 Congenital pes cavus, left foot: Secondary | ICD-10-CM

## 2022-09-19 DIAGNOSIS — Z8619 Personal history of other infectious and parasitic diseases: Secondary | ICD-10-CM

## 2022-09-19 DIAGNOSIS — Z8639 Personal history of other endocrine, nutritional and metabolic disease: Secondary | ICD-10-CM

## 2022-09-19 DIAGNOSIS — M2042 Other hammer toe(s) (acquired), left foot: Secondary | ICD-10-CM

## 2022-09-19 DIAGNOSIS — M79642 Pain in left hand: Secondary | ICD-10-CM

## 2022-09-19 DIAGNOSIS — E559 Vitamin D deficiency, unspecified: Secondary | ICD-10-CM

## 2022-09-19 DIAGNOSIS — Z8669 Personal history of other diseases of the nervous system and sense organs: Secondary | ICD-10-CM

## 2022-09-19 NOTE — Patient Instructions (Addendum)
Hydroxychloroquine Tablets What is this medication? HYDROXYCHLOROQUINE (hye drox ee KLOR oh kwin) treats autoimmune conditions, such as rheumatoid arthritis and lupus. It works by slowing down an overactive immune system. It may also be used to prevent and treat malaria. It works by killing the parasite that causes malaria. It belongs to a group of medications called DMARDs. This medicine may be used for other purposes; ask your health care provider or pharmacist if you have questions. COMMON BRAND NAME(S): Plaquenil, Quineprox What should I tell my care team before I take this medication? They need to know if you have any of these conditions: Diabetes Eye disease, vision problems G6PD deficiency Heart disease History of irregular heartbeat If you often drink alcohol Kidney disease Liver disease Porphyria Psoriasis An unusual or allergic reaction to chloroquine, hydroxychloroquine, other medications, foods, dyes, or preservatives Pregnant or trying to get pregnant Breast-feeding How should I use this medication? Take this medication by mouth with a glass of water. Take it as directed on the prescription label. Do not cut, crush or chew this medication. Swallow the tablets whole. Take it with food. Do not take it more than directed. Take all of this medication unless your care team tells you to stop it early. Keep taking it even if you think you are better. Take products with antacids in them at a different time of day than this medication. Take this medication 4 hours before or 4 hours after antacids. Talk to your care team if you have questions. Talk to your care team about the use of this medication in children. While this medication may be prescribed for selected conditions, precautions do apply. Overdosage: If you think you have taken too much of this medicine contact a poison control center or emergency room at once. NOTE: This medicine is only for you. Do not share this medicine with  others. What if I miss a dose? If you miss a dose, take it as soon as you can. If it is almost time for your next dose, take only that dose. Do not take double or extra doses. What may interact with this medication? Do not take this medication with any of the following: Cisapride Dronedarone Pimozide Thioridazine This medication may also interact with the following: Ampicillin Antacids Cimetidine Cyclosporine Digoxin Kaolin Medications for diabetes, like insulin, glipizide, glyburide Medications for seizures like carbamazepine, phenobarbital, phenytoin Mefloquine Methotrexate Other medications that prolong the QT interval (cause an abnormal heart rhythm) Praziquantel This list may not describe all possible interactions. Give your health care provider a list of all the medicines, herbs, non-prescription drugs, or dietary supplements you use. Also tell them if you smoke, drink alcohol, or use illegal drugs. Some items may interact with your medicine. What should I watch for while using this medication? Visit your care team for regular checks on your progress. Tell your care team if your symptoms do not start to get better or if they get worse. You may need blood work done while you are taking this medication. If you take other medications that can affect heart rhythm, you may need more testing. Talk to your care team if you have questions. Your vision may be tested before and during use of this medication. Tell your care team right away if you have any change in your eyesight. This medication may cause serious skin reactions. They can happen weeks to months after starting the medication. Contact your care team right away if you notice fevers or flu-like symptoms with a rash. The   rash may be red or purple and then turn into blisters or peeling of the skin. Or, you might notice a red rash with swelling of the face, lips or lymph nodes in your neck or under your arms. If you or your family  notice any changes in your behavior, such as new or worsening depression, thoughts of harming yourself, anxiety, or other unusual or disturbing thoughts, or memory loss, call your care team right away. What side effects may I notice from receiving this medication? Side effects that you should report to your care team as soon as possible: Allergic reactions--skin rash, itching, hives, swelling of the face, lips, tongue, or throat Aplastic anemia--unusual weakness or fatigue, dizziness, headache, trouble breathing, increased bleeding or bruising Change in vision Heart rhythm changes--fast or irregular heartbeat, dizziness, feeling faint or lightheaded, chest pain, trouble breathing Infection--fever, chills, cough, or sore throat Low blood sugar (hypoglycemia)--tremors or shaking, anxiety, sweating, cold or clammy skin, confusion, dizziness, rapid heartbeat Muscle injury--unusual weakness or fatigue, muscle pain, dark yellow or brown urine, decrease in amount of urine Pain, tingling, or numbness in the hands or feet Rash, fever, and swollen lymph nodes Redness, blistering, peeling, or loosening of the skin, including inside the mouth Thoughts of suicide or self-harm, worsening mood, or feelings of depression Unusual bruising or bleeding Side effects that usually do not require medical attention (report to your care team if they continue or are bothersome): Diarrhea Headache Nausea Stomach pain Vomiting This list may not describe all possible side effects. Call your doctor for medical advice about side effects. You may report side effects to FDA at 1-800-FDA-1088. Where should I keep my medication? Keep out of the reach of children and pets. Store at room temperature up to 30 degrees C (86 degrees F). Protect from light. Get rid of any unused medication after the expiration date. To get rid of medications that are no longer needed or have expired: Take the medication to a medication take-back  program. Check with your pharmacy or law enforcement to find a location. If you cannot return the medication, check the label or package insert to see if the medication should be thrown out in the garbage or flushed down the toilet. If you are not sure, ask your care team. If it is safe to put it in the trash, empty the medication out of the container. Mix the medication with cat litter, dirt, coffee grounds, or other unwanted substance. Seal the mixture in a bag or container. Put it in the trash. NOTE: This sheet is a summary. It may not cover all possible information. If you have questions about this medicine, talk to your doctor, pharmacist, or health care provider.  2023 Elsevier/Gold Standard (2021-03-08 00:00:00)  Certolizumab Pegol Injection What is this medication? CERTOLIZUMAB (SER toe LIZ oo mab) treats autoimmune conditions, such as psoriasis, arthritis, and Crohn's disease. It works by slowing down an overactive immune system. It belongs to a group of medications called TNF inhibitors. It is a monoclonal antibody. This medicine may be used for other purposes; ask your health care provider or pharmacist if you have questions. COMMON BRAND NAME(S): Cimzia What should I tell my care team before I take this medication? They need to know if you have any of these conditions: Cancer Diabetes Guillain-Barre syndrome Heart failure Hepatitis B or history of hepatitis B infection Immune system problems Infection or history of infections Low blood counts, such as low white cell, platelet, or red cell counts Multiple sclerosis Recently  received or scheduled to receive a vaccine Tuberculosis, a positive skin test for tuberculosis, or recent close contact with someone who has tuberculosis An unusual or allergic reaction to certolizumab, other medications, latex, rubber, foods, dyes, or preservatives Pregnant or trying to get pregnant Breast-feeding How should I use this medication? This  medication is injected under the skin. It is usually given by your care team in a hospital or clinic setting. It may also be given at home. If you get this medication at home, you will be taught how to prepare and give it. Use exactly as directed. Take it as directed on the prescription label at the same time every day. Keep taking it unless your care team tells you to stop. It is important that you put your used needles and syringes in a special sharps container. Do not put them in a trash can. If you do not have a sharps container, call your pharmacist or care team to get one. A special MedGuide will be given to you by the pharmacist with each prescription and refill. Be sure to read this information carefully each time. Talk to your care team about the use of this medication in children. Special care may be needed. Overdosage: If you think you have taken too much of this medicine contact a poison control center or emergency room at once. NOTE: This medicine is only for you. Do not share this medicine with others. What if I miss a dose? If you get this medication at a hospital or clinic: It is important not to miss your dose. Call your care team if you are unable to keep an appointment. If you give yourself this medication at home: If you miss a dose, take it as soon as you can. If it is almost time for your next dose, take only that dose. Do not take double or extra doses. Call your care team with questions. What may interact with this medication? Do not take this medication with any of the following: Biologic medications, such as abatacept, adalimumab, anakinra, etanercept, golimumab, infliximab, natalizumab, rituximab, secukinumab, tocilizumab, ustekinumab Live vaccines Tofacitinib This list may not describe all possible interactions. Give your health care provider a list of all the medicines, herbs, non-prescription drugs, or dietary supplements you use. Also tell them if you smoke, drink  alcohol, or use illegal drugs. Some items may interact with your medicine. What should I watch for while using this medication? Visit your care team for regular checks on your progress. Tell your care team if your symptoms do not start to get better or if they get worse. Your condition will be monitored carefully while you are receiving this medication. You will be tested for tuberculosis (TB) before you start this medication. If your care team prescribes any medication for TB, you should start taking the TB medication before starting this medication. Make sure to finish the full course of TB medication. This medication may increase your risk of getting an infection. Call your care team for advice if you get a fever, chills, sore throat, or other symptoms of a cold or flu. Do not treat yourself. Try to avoid being around people who are sick. Talk to your care team about your risk of cancer. You may be more at risk for certain types of cancers if you take this medication. What side effects may I notice from receiving this medication? Side effects that you should report to your care team as soon as possible: Allergic reactions--skin rash, itching, hives,  swelling of the face, lips, tongue, or throat Aplastic anemia--unusual weakness or fatigue, dizziness, headache, trouble breathing, increased bleeding or bruising Body pain, tingling, or numbness Heart failure--shortness of breath, swelling of the ankles, feet, or hands, sudden weight gain, unusual weakness or fatigue Infection--fever, chills, cough, sore throat, wounds that don't heal, pain or trouble when passing urine, general feeling of discomfort or being unwell Lupus-like syndrome--joint pain, swelling, or stiffness, butterfly-shaped rash on the face, rashes that get worse in the sun, fever, unusual weakness or fatigue Seizures Unusual bruising or bleeding Side effects that usually do not require medical attention (report to your care team if  they continue or are bothersome): Back pain Cough Fatigue Fever Headache Pain, redness, or irritation at injection site Sore throat This list may not describe all possible side effects. Call your doctor for medical advice about side effects. You may report side effects to FDA at 1-800-FDA-1088. Where should I keep my medication? Keep out of the reach of children and pets. Store in the refrigerator. Do not freeze. Keep this medication in the original packaging until you are ready to take it. Protect from light. Get rid of any unused medication after the expiration date. To get rid of medications that are no longer needed or have expired: Take the medication to a medication take-back program. Check with your pharmacy or law enforcement to find a location. If you cannot return the medication, ask your pharmacist or care team how to get rid of this medication safely. NOTE: This sheet is a summary. It may not cover all possible information. If you have questions about this medicine, talk to your doctor, pharmacist, or health care provider.  2023 Elsevier/Gold Standard (2022-02-01 00:00:00)

## 2022-09-28 ENCOUNTER — Ambulatory Visit: Payer: BC Managed Care – PPO | Admitting: Podiatry

## 2022-09-28 ENCOUNTER — Encounter: Payer: Self-pay | Admitting: Podiatry

## 2022-09-28 DIAGNOSIS — G5761 Lesion of plantar nerve, right lower limb: Secondary | ICD-10-CM | POA: Diagnosis not present

## 2022-09-28 DIAGNOSIS — M7752 Other enthesopathy of left foot: Secondary | ICD-10-CM

## 2022-09-28 NOTE — Patient Instructions (Signed)

## 2022-10-01 NOTE — Progress Notes (Signed)
Subjective: Chief Complaint  Patient presents with   Foot Pain    Follow up bilateral feet   "The rheumatologist thinks it has something to so with my RA"    41 year old female presents the office today for follow evaluation as well as to pick up orthotics.  She wants to proceed with a steroid injection on the right foot.  She is also any pain on the left fifth MPJ she asks about possibly getting an injection there as well.  No recent injury or changes otherwise.  Objective: AAO x3, NAD DP/PT pulses palpable bilaterally, CRT less than 3 seconds On the right second interspace small palpable nodules noted consistent with a bursa versus neuroma.  There is no area pinpoint tenderness.  There is no edema, erythema at this time.  She is also continue Zoloft fifth imaging is no edema.  There is no erythema or warmth to this area.  No area pinpoint tenderness otherwise.  MMT 5/5.  Hammertoes present. 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  -Orthotics were dispensed.  Oral and written break-in instructions provided to the patient. -Steroid injection performed the right second interspace on the area of tenderness today without any complications.  Skin was cleaned with alcohol mixture 1 cc Kenalog 10, 0.5 cc of Marcaine plain, 0.5 cc of lidocaine plain was infiltrated into the second interspace without complications.  Postinjection care discussed.  Tolerated well. -If needed consider steroid injection left foot as well. -Patient encouraged to call the office with any questions, concerns, change in symptoms.   Trula Slade DPM

## 2022-10-11 ENCOUNTER — Ambulatory Visit
Admission: RE | Admit: 2022-10-11 | Discharge: 2022-10-11 | Disposition: A | Discharge status: Home / Self Care | Payer: BC Managed Care – PPO | Source: Ambulatory Visit | Attending: Obstetrics and Gynecology | Admitting: Obstetrics and Gynecology

## 2022-10-11 DIAGNOSIS — N6489 Other specified disorders of breast: Secondary | ICD-10-CM

## 2022-10-16 ENCOUNTER — Telehealth: Payer: Self-pay | Admitting: Podiatry

## 2022-10-16 NOTE — Telephone Encounter (Signed)
Patient would like to order orthotics for her dress shoes, but she would like to have them remeasured because she doesn't think the left one is correct, it just does not feel right

## 2022-10-22 ENCOUNTER — Ambulatory Visit: Payer: BC Managed Care – PPO | Admitting: Podiatry

## 2022-10-30 NOTE — Progress Notes (Deleted)
Office Visit Note  Patient: Autumn Mata             Date of Birth: Jan 22, 1981           MRN: [id number]             PCP: Pcp, No Referring: No ref. provider found Visit Date: 11/12/2022 Occupation: @GUAROCC @  Subjective:  No chief complaint on file.   History of Present Illness: Autumn Mata is a 41 y.o. female ***   Activities of Daily Living:  Patient reports morning stiffness for *** {minute/hour:19697}.   Patient {ACTIONS;DENIES/REPORTS:21021675::"Denies"} nocturnal pain.  Difficulty dressing/grooming: {ACTIONS;DENIES/REPORTS:21021675::"Denies"} Difficulty climbing stairs: {ACTIONS;DENIES/REPORTS:21021675::"Denies"} Difficulty getting out of chair: {ACTIONS;DENIES/REPORTS:21021675::"Denies"} Difficulty using hands for taps, buttons, cutlery, and/or writing: {ACTIONS;DENIES/REPORTS:21021675::"Denies"}  No Rheumatology ROS completed.   PMFS History:  Patient Active Problem List   Diagnosis Date Noted   Rheumatoid arthritis involving multiple sites with positive rheumatoid factor (HCC) 09/19/2022   Pes cavus of both feet 08/30/2022   Hammer toes of both feet 08/30/2022   Unspecified hypothyroidism 11/16/2013   Eczema 11/16/2013    Past Medical History:  Diagnosis Date   Eczema    Hypothyroidism    Thyroid disease    Tinea versicolor     Family History  Problem Relation Age of Onset   Stroke Mother    Hypertension Father    Asthma Father    Heart attack Father    Asthma Sister    Healthy Son    Healthy Son    No past surgical history on file. Social History   Social History Narrative   Marital Status: Married 11/18/2013)   Children:  Sons (Caden 2008) & 2009 2011)    Pets: Dog (Yorkie)   Living Situation: Lives with spouse and children   Occupation: 2012    Education: Futures trader   Tobacco Use/Exposure:  None    Alcohol Use:  Occasional   Drug Use:  None   Diet:  Regular   Exercise:     Hobbies: Cooking, Travel           Immunization History  Administered Date(s) Administered   Tdap 07/04/2006     Objective: Vital Signs: There were no vitals taken for this visit.   Physical Exam   Musculoskeletal Exam: ***  CDAI Exam: CDAI Score: -- Patient Global: --; Provider Global: -- Swollen: --; Tender: -- Joint Exam 11/12/2022   No joint exam has been documented for this visit   There is currently no information documented on the homunculus. Go to the Rheumatology activity and complete the homunculus joint exam.  Investigation: No additional findings.  Imaging: MM DIAG BREAST TOMO BILATERAL  Result Date: 10/11/2022 CLINICAL DATA:  One year interval follow-up of a likely benign asymmetry in the upper LEFT breast at posterior depth, visible only on the MLO view, identified initially on baseline mammography. Annual evaluation, RIGHT breast. EXAM: DIGITAL DIAGNOSTIC BILATERAL MAMMOGRAM WITH TOMOSYNTHESIS TECHNIQUE: Bilateral digital diagnostic mammography and breast tomosynthesis was performed. COMPARISON:  Previous exam(s). ACR Breast Density Category d: The breast tissue is extremely dense, which lowers the sensitivity of mammography. FINDINGS: Full field CC and MLO views of both breasts were obtained. RIGHT: No findings suspicious for malignancy. LEFT: The asymmetry in the upper breast at posterior depth is unchanged dating back to the baseline mammogram in September, 2022 and likely represents an 07-05-2004 of fibroglandular tissue. No new or suspicious findings elsewhere. IMPRESSION: 1. Stable likely benign asymmetry in the upper LEFT breast  at posterior depth. 2. No mammographic evidence of malignancy involving the RIGHT breast. RECOMMENDATION: BILATERAL diagnostic mammography in 1 year (in order to confirm 2 years of stability of the LEFT breast asymmetry). I have discussed the findings and recommendations with the patient. If applicable, a reminder letter will be sent to the patient regarding the next  appointment. BI-RADS CATEGORY  3: Probably benign. Electronically Signed   By: Hulan Saas M.D.   On: 10/11/2022 11:42   Recent Labs: Lab Results  Component Value Date   WBC 6.4 08/30/2022   HGB 15.5 08/30/2022   PLT 307 08/30/2022   NA 137 08/30/2022   K 3.8 08/30/2022   CL 97 (L) 08/30/2022   CO2 30 08/30/2022   GLUCOSE 84 08/30/2022   BUN 10 08/30/2022   CREATININE 0.77 08/30/2022   BILITOT 1.2 08/30/2022   ALKPHOS 40 11/18/2013   AST 18 08/30/2022   ALT 17 08/30/2022   PROT 7.4 08/30/2022   ALBUMIN 4.5 11/18/2013   CALCIUM 9.8 08/30/2022   GFRAA >89 11/18/2013    Speciality Comments: No specialty comments available.  Procedures:  No procedures performed Allergies: Patient has no known allergies.   Assessment / Plan:     Visit Diagnoses: No diagnosis found.  Orders: No orders of the defined types were placed in this encounter.  No orders of the defined types were placed in this encounter.   Face-to-face time spent with patient was *** minutes. Greater than 50% of time was spent in counseling and coordination of care.  Follow-Up Instructions: No follow-ups on file.   Ellen Henri, CMA  Note - This record has been created using Animal nutritionist.  Chart creation errors have been sought, but may not always  have been located. Such creation errors do not reflect on  the standard of medical care.

## 2022-11-09 ENCOUNTER — Ambulatory Visit: Payer: BC Managed Care – PPO | Admitting: Podiatry

## 2022-11-09 DIAGNOSIS — M7752 Other enthesopathy of left foot: Secondary | ICD-10-CM

## 2022-11-09 DIAGNOSIS — G5761 Lesion of plantar nerve, right lower limb: Secondary | ICD-10-CM

## 2022-11-09 NOTE — Progress Notes (Unsigned)
Subjective: 41 year old female presents the office today with above concerns.  She said that she feels that the orthotics are helping a lot.  She is on the left side causing her toes to push up.  She is interested in getting another pair of orthotics, dress pair.  Objective: AAO x3, NAD DP/PT pulses palpable bilaterally, CRT less than 3 seconds Overall no significant pain today.  Orthotics appear to be fitting well.  No edema, erythema. No pain with calf compression, swelling, warmth, erythema  Assessment: 41 year old female with rheumatoid arthritis, neuroma   Plan: -All treatment options discussed with the patient including all alternatives, risks, complications.  -Otoscopy feeling well.  I did trim the left side it seems to be fitting better.  If this is still not helping we could also grind this down.  I will order her a pair of dress orthotics but we will do a full-length orthotic and then cut it down to just inside of her shoes. -Patient encouraged to call the office with any questions, concerns, change in symptoms.   Vivi Barrack DPM

## 2022-11-12 ENCOUNTER — Ambulatory Visit: Payer: BC Managed Care – PPO | Admitting: Rheumatology

## 2022-11-12 DIAGNOSIS — Q6672 Congenital pes cavus, left foot: Secondary | ICD-10-CM

## 2022-11-12 DIAGNOSIS — M0579 Rheumatoid arthritis with rheumatoid factor of multiple sites without organ or systems involvement: Secondary | ICD-10-CM

## 2022-11-12 DIAGNOSIS — Z8669 Personal history of other diseases of the nervous system and sense organs: Secondary | ICD-10-CM

## 2022-11-12 DIAGNOSIS — Z8639 Personal history of other endocrine, nutritional and metabolic disease: Secondary | ICD-10-CM

## 2022-11-12 DIAGNOSIS — M79642 Pain in left hand: Secondary | ICD-10-CM

## 2022-11-12 DIAGNOSIS — M2041 Other hammer toe(s) (acquired), right foot: Secondary | ICD-10-CM

## 2022-11-12 DIAGNOSIS — E559 Vitamin D deficiency, unspecified: Secondary | ICD-10-CM

## 2022-11-12 DIAGNOSIS — B360 Pityriasis versicolor: Secondary | ICD-10-CM

## 2022-11-12 DIAGNOSIS — Z8619 Personal history of other infectious and parasitic diseases: Secondary | ICD-10-CM

## 2022-11-12 DIAGNOSIS — Z79899 Other long term (current) drug therapy: Secondary | ICD-10-CM

## 2022-11-12 DIAGNOSIS — H04123 Dry eye syndrome of bilateral lacrimal glands: Secondary | ICD-10-CM

## 2022-11-12 DIAGNOSIS — R768 Other specified abnormal immunological findings in serum: Secondary | ICD-10-CM

## 2022-12-21 ENCOUNTER — Other Ambulatory Visit: Payer: BC Managed Care – PPO

## 2022-12-24 ENCOUNTER — Ambulatory Visit: Payer: BC Managed Care – PPO

## 2023-01-02 ENCOUNTER — Ambulatory Visit: Payer: BC Managed Care – PPO

## 2023-01-02 DIAGNOSIS — Q6671 Congenital pes cavus, right foot: Secondary | ICD-10-CM

## 2023-01-02 NOTE — Progress Notes (Signed)
Patient presents to the office today to pick up custom inserts.   Will send orthotics to lab to make dress sulcus length orthotics.  Advised patient that she will receive a call from the office to come in for a fitting.

## 2023-01-08 NOTE — Progress Notes (Deleted)
Patient presents today to pick up custom molded foot orthotics recommended by Dr. Jacqualyn Posey.   Orthotics were unavailable but will await a call for a pick up appointment.

## 2023-01-21 ENCOUNTER — Ambulatory Visit (INDEPENDENT_AMBULATORY_CARE_PROVIDER_SITE_OTHER): Payer: BC Managed Care – PPO

## 2023-01-21 DIAGNOSIS — Q6671 Congenital pes cavus, right foot: Secondary | ICD-10-CM

## 2023-01-21 DIAGNOSIS — G5761 Lesion of plantar nerve, right lower limb: Secondary | ICD-10-CM

## 2023-01-21 DIAGNOSIS — Q6672 Congenital pes cavus, left foot: Secondary | ICD-10-CM

## 2023-01-21 NOTE — Progress Notes (Signed)
Patient presents today to pick up custom molded foot orthotics recommended by Dr. Jacqualyn Posey.   Orthotics were dispensed and fit was satisfactory. Reviewed instructions for break-in and wear. Written instructions given to patient.  Patient will follow up as needed.   [facility] - order # [id number]

## 2023-07-30 ENCOUNTER — Other Ambulatory Visit: Payer: Self-pay | Admitting: Podiatry

## 2023-10-07 ENCOUNTER — Other Ambulatory Visit: Payer: Self-pay | Admitting: Obstetrics and Gynecology

## 2023-10-07 DIAGNOSIS — N6489 Other specified disorders of breast: Secondary | ICD-10-CM

## 2023-10-18 IMAGING — MG MM DIGITAL DIAGNOSTIC UNILAT*L* W/ TOMO W/ CAD
6 series · 6 of 18 positions shown · non-contrast
Comparison: Previous exam(s).

CLINICAL DATA: Six-month follow-up of a left breast asymmetry.

EXAM:
DIGITAL DIAGNOSTIC UNILATERAL LEFT MAMMOGRAM WITH TOMOSYNTHESIS AND
CAD
TECHNIQUE: Left digital diagnostic mammography and breast tomosynthesis was
performed. The images were evaluated with computer-aided detection.

[L CC synth-2D]
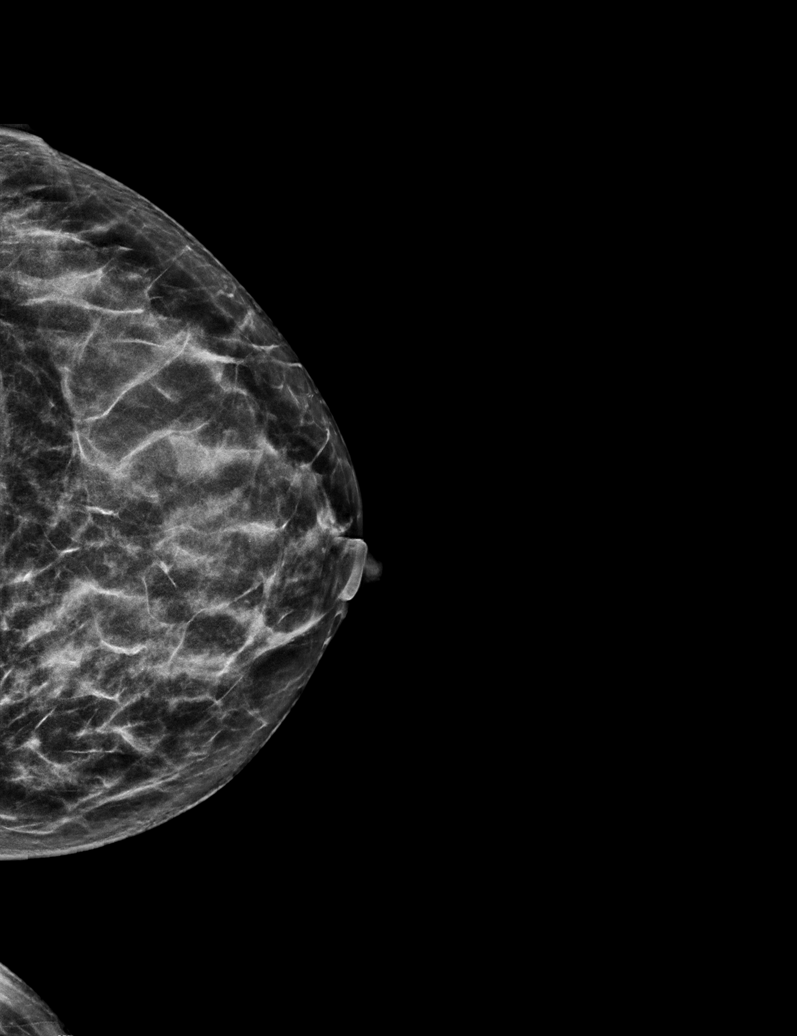

[L MLO synth-2D (1 of 2)]
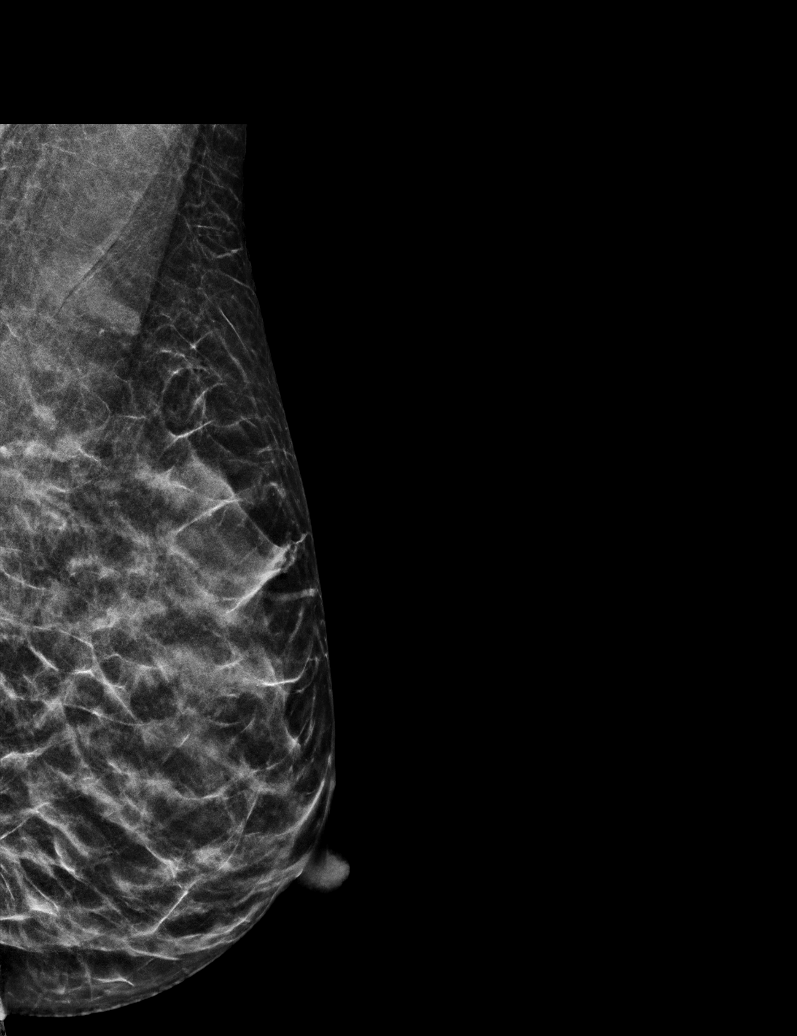

[L MLO synth-2D (2 of 2)]
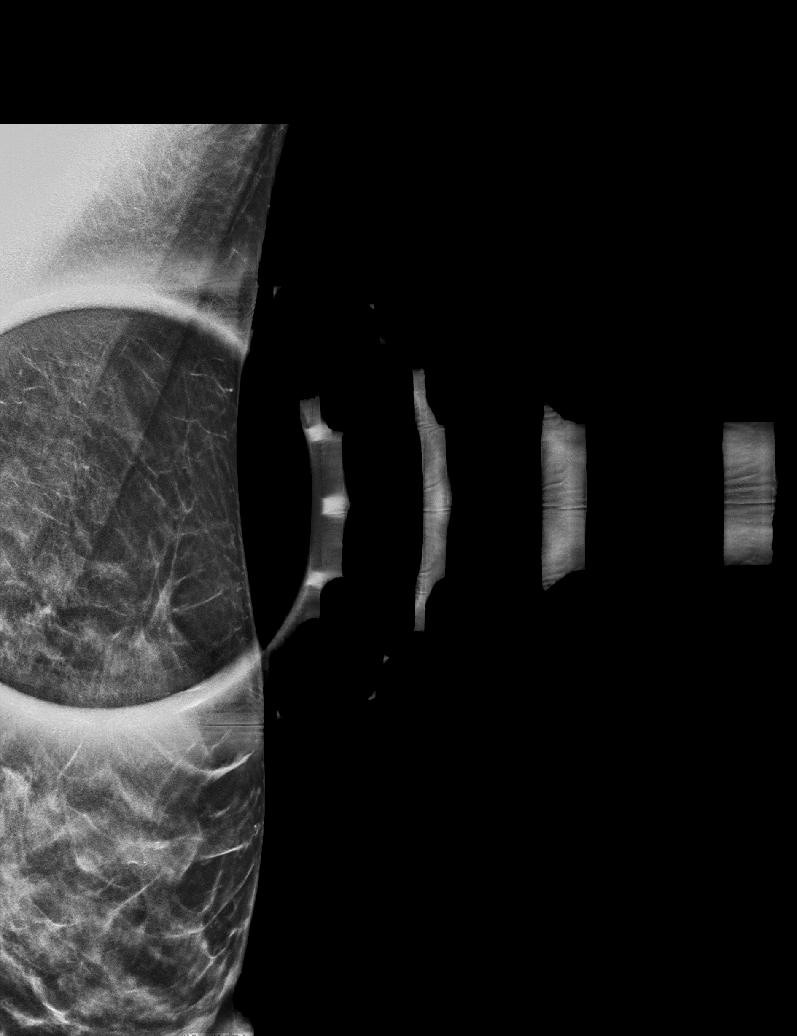

[L CC tomo · tomo slice 24/47.0]
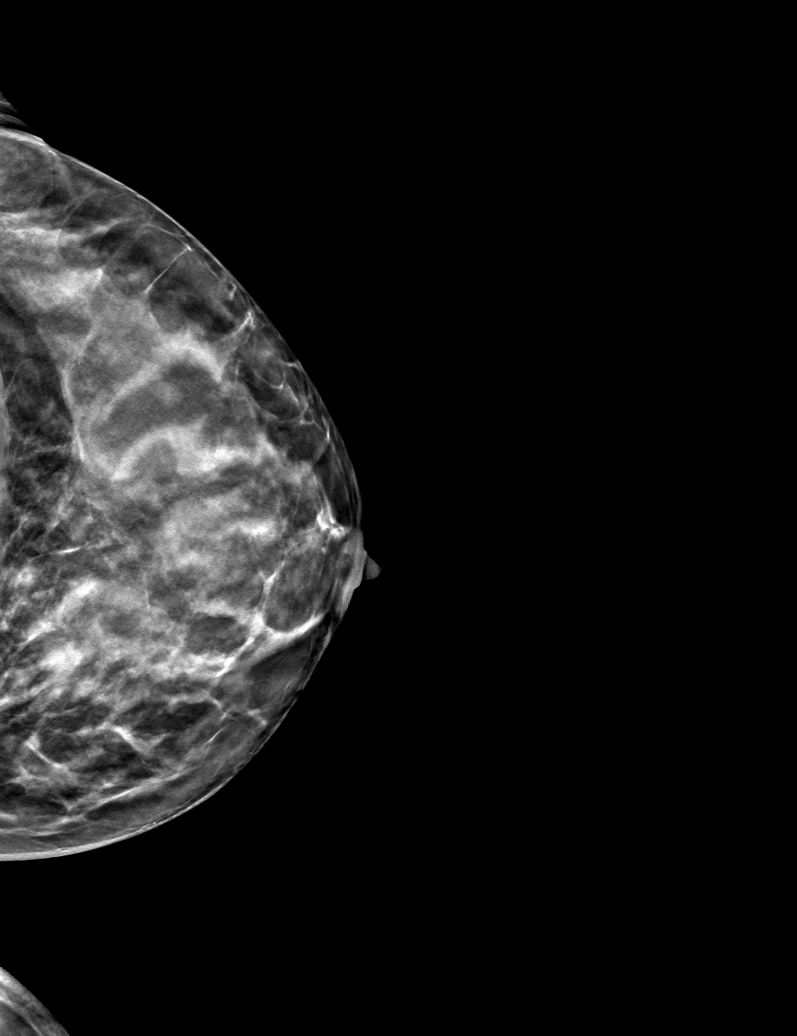

[L MLO tomo (1 of 2) · tomo slice 26/51.0]
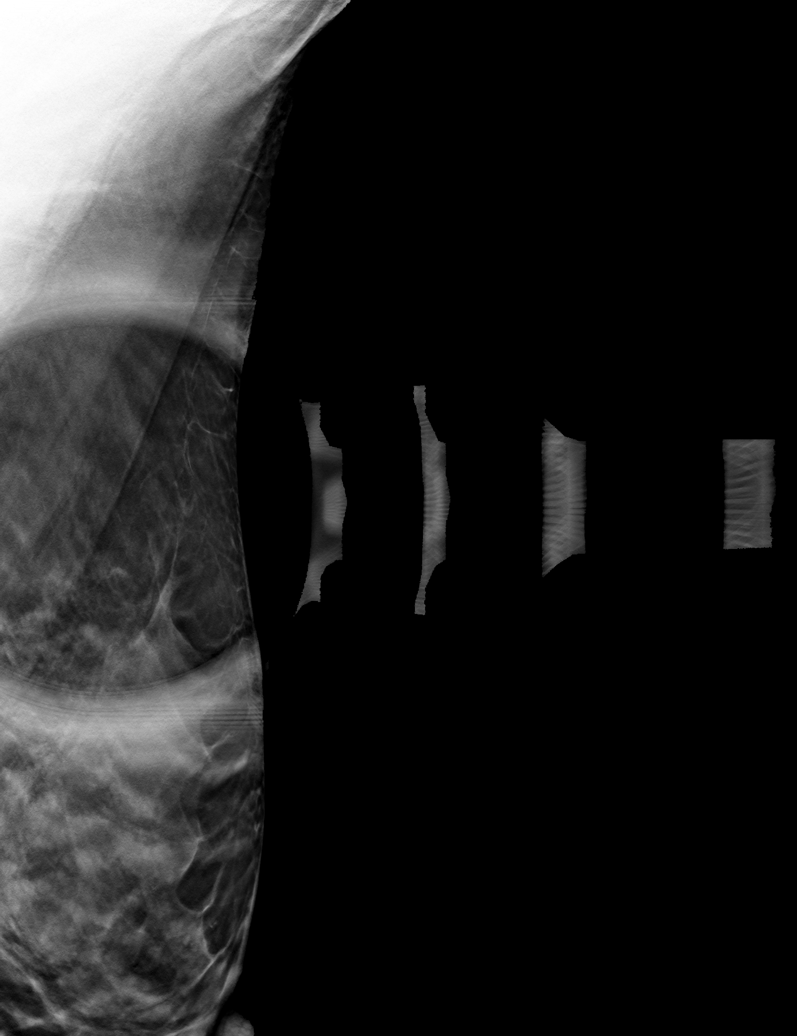

[L MLO tomo (2 of 2) · tomo slice 22/43.0]
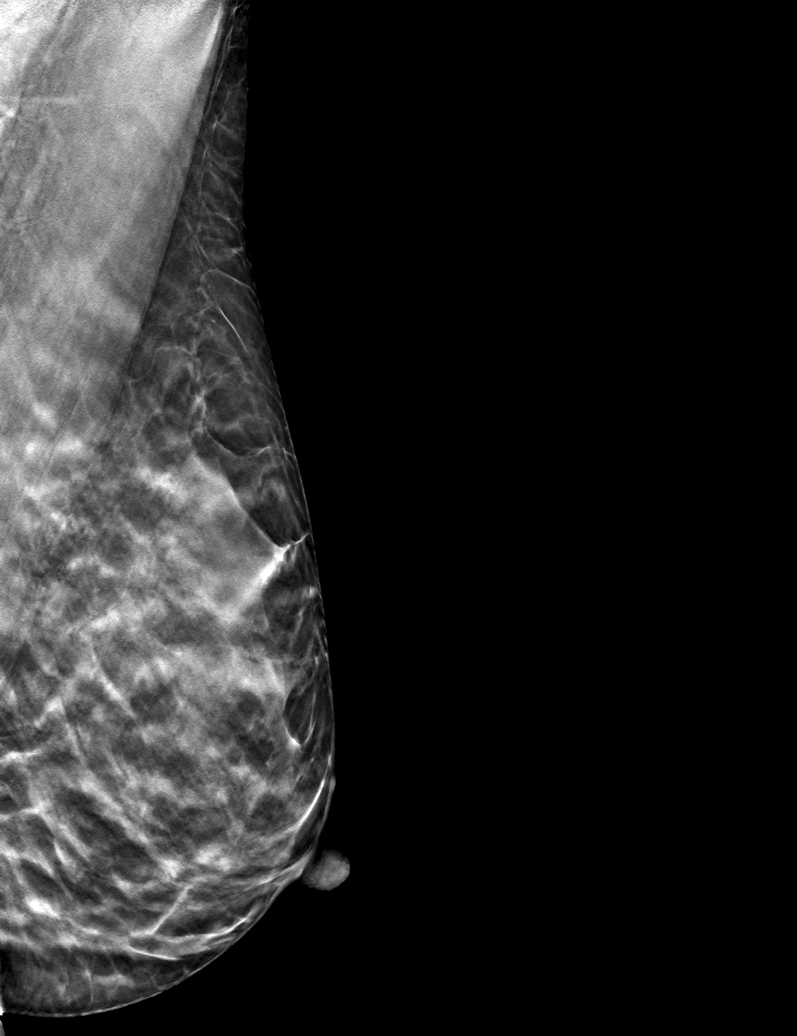

[6 of 18 positions shown; findings below may reference images not displayed]

ACR Breast Density Category c: The breast tissue is heterogeneously
dense, which may obscure small masses.
FINDINGS: The left breast asymmetry is stable. No new or suspicious findings
in the left breast.
IMPRESSION: No mammographic evidence of malignancy.

RECOMMENDATION:
Six-month follow-up mammography of the left breast asymmetry. The
patient will be due for bilateral mammography at that time.

I have discussed the findings and recommendations with the patient.
If applicable, a reminder letter will be sent to the patient
regarding the next appointment.

BI-RADS CATEGORY  3: Probably benign.

## 2024-03-09 ENCOUNTER — Other Ambulatory Visit: Payer: Self-pay

## 2024-03-09 DIAGNOSIS — E559 Vitamin D deficiency, unspecified: Secondary | ICD-10-CM

## 2024-03-09 DIAGNOSIS — M059 Rheumatoid arthritis with rheumatoid factor, unspecified: Secondary | ICD-10-CM

## 2024-03-18 ENCOUNTER — Ambulatory Visit
Admission: RE | Admit: 2024-03-18 | Discharge: 2024-03-18 | Disposition: A | Discharge status: Home / Self Care | Source: Ambulatory Visit

## 2024-03-18 DIAGNOSIS — M059 Rheumatoid arthritis with rheumatoid factor, unspecified: Secondary | ICD-10-CM

## 2024-03-18 DIAGNOSIS — E559 Vitamin D deficiency, unspecified: Secondary | ICD-10-CM

## 2024-04-23 ENCOUNTER — Ambulatory Visit: Admitting: Podiatry

## 2024-04-23 DIAGNOSIS — M7752 Other enthesopathy of left foot: Secondary | ICD-10-CM | POA: Diagnosis not present

## 2024-04-23 DIAGNOSIS — B070 Plantar wart: Secondary | ICD-10-CM

## 2024-04-23 DIAGNOSIS — Q6671 Congenital pes cavus, right foot: Secondary | ICD-10-CM

## 2024-04-23 DIAGNOSIS — Q6672 Congenital pes cavus, left foot: Secondary | ICD-10-CM | POA: Diagnosis not present

## 2024-04-23 DIAGNOSIS — M7751 Other enthesopathy of right foot: Secondary | ICD-10-CM | POA: Diagnosis not present

## 2024-04-23 NOTE — Patient Instructions (Signed)
 Take dressing off in 8 hours and wash the foot with soap and water. If it is hurting or becomes uncomfortable before the 8 hours, go ahead and remove the bandage and wash the area.  If it blisters, apply antibiotic ointment and a band-aid.  Monitor for any signs/symptoms of infection. Call the office immediately if any occur or go directly to the emergency room. Call with any questions/concerns.

## 2024-04-27 NOTE — Progress Notes (Signed)
  Subjective:  Patient ID: Autumn Mata, female    DOB: 11-12-1981,  MRN: [id number]  Chief Complaint  Patient presents with   Foot Pain    RM#12 Patient states has a bump filled fluid on right foot  and wart on left foot. MRI performed by pcp.    Discussed the use of AI scribe software for clinical note transcription with the patient, who gave verbal consent to proceed.  History of Present Illness Autumn Mata is a 43 year old female with rheumatoid arthritis who presents with a bump on her foot.  She has had a bump on her foot for six to seven months, initially suspecting it to be a rheumatoid nodule. An MRI shows fluid presence, but no specific medication has been prescribed for this.  She experiences extensor contracture of her toes, which she associates with activities like barre classes. She wears tennis shoes during these classes to reduce pressure.  She suspects a plantar wart on her left foot, self-diagnosed about a month and a half ago. The wart is painful, and she uses medicated patches for treatment.  Her current medications include leflunomide, which she is attempting to discontinue.      Objective:    Physical Exam General: AAO x3, NAD  Dermatological: On the plantar aspect of the left foot submetatarsal 2, there is a hyperkeratotic lesion consistent with a verruca.  There is no edema, erythema or signs of infection.  Vascular: Dorsalis Pedis artery and Posterior Tibial artery pedal pulses are 2/4 bilateral with immedate capillary fill time.  There is no pain with calf compression, swelling, warmth, erythema.   Neruologic: Grossly intact via light touch bilateral.   Musculoskeletal: Supinated foot type is present.  There is tenderness submetatarsal 2 and there is some localized swelling present at this area.  No drainable collection noted.  There is no fluctuation or crepitation.  There is no erythema.  No area pinpoint tenderness.  Gait: Unassisted,  Nonantalgic.     No images are attached to the encounter.    Results   Procedure: Plantar Wart Treatment Description: Sharply debrided lesion without any complications or bleeding.  Applied Cantharone to plantar wart on the left foot. Advised to leave bandage on for eight hours and then wash off with soap and water.    Assessment:   1. Pes cavus of both feet   2. Bursitis of both feet   3.     Plantar wart   Plan:  Patient was evaluated and treated and all questions answered.  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 MRI shows fluid accumulation which is likely coming from pressure.   - Use Voltaren gel as topical anti-inflammatory. - Apply ice to affected area. - Perform exercises to reduce pressure and inflammation. - Use metatarsal pads to alleviate pressure. -Offered steroid injection - Continue wearing supportive shoes with inserts. - Schedule follow-up in 4-6 weeks to reassess. Consider steroid injection if no improvement  Plantar wart Self-diagnosed plantar wart on left foot, present for 1.5 months. Painful but nearly resolved with treatment. - Apply acid treatment to wart today. - Leave bandage on for 8 hours, then wash off with soap and water. - Monitor for signs of infection. - Allow skin to air out after treatment, apply antibiotic ointment if needed.    Return for possible insert modifications with Kerney Pee .   Charity Conch DPM

## 2024-05-06 ENCOUNTER — Other Ambulatory Visit

## 2024-05-07 ENCOUNTER — Ambulatory Visit

## 2024-05-07 NOTE — Progress Notes (Signed)
 Patient was her old orthotics checked for MT pads and confirmed both pair have them also fit is still real nice  Patient will fu if needed and will drop off orthotics if she feels later down the road that they need to be sent out to be refurbished

## 2024-06-22 ENCOUNTER — Other Ambulatory Visit: Payer: Self-pay | Admitting: Orthopedic Surgery

## 2024-06-22 DIAGNOSIS — M25521 Pain in right elbow: Secondary | ICD-10-CM

## 2024-06-23 ENCOUNTER — Encounter: Payer: Self-pay | Admitting: Orthopedic Surgery

## 2024-06-24 ENCOUNTER — Ambulatory Visit
Admission: RE | Admit: 2024-06-24 | Discharge: 2024-06-24 | Disposition: A | Discharge status: Home / Self Care | Payer: Self-pay | Source: Ambulatory Visit | Attending: Obstetrics and Gynecology | Admitting: Obstetrics and Gynecology

## 2024-06-24 DIAGNOSIS — N6489 Other specified disorders of breast: Secondary | ICD-10-CM

## 2024-06-25 ENCOUNTER — Other Ambulatory Visit: Payer: Self-pay

## 2024-06-25 DIAGNOSIS — M79672 Pain in left foot: Secondary | ICD-10-CM

## 2024-06-26 ENCOUNTER — Other Ambulatory Visit: Payer: Self-pay

## 2024-06-30 ENCOUNTER — Other Ambulatory Visit

## 2024-07-02 ENCOUNTER — Ambulatory Visit
Admission: RE | Admit: 2024-07-02 | Discharge: 2024-07-02 | Disposition: A | Discharge status: Home / Self Care | Source: Ambulatory Visit

## 2024-07-02 ENCOUNTER — Inpatient Hospital Stay
Admission: RE | Admit: 2024-07-02 | Discharge: 2024-07-02 | Discharge status: Home / Self Care | Source: Ambulatory Visit | Attending: Orthopedic Surgery

## 2024-07-02 DIAGNOSIS — M25521 Pain in right elbow: Secondary | ICD-10-CM

## 2024-07-02 DIAGNOSIS — M79672 Pain in left foot: Secondary | ICD-10-CM
